# Patient Record
Sex: Male | Born: 1970 | Race: White | Hispanic: No | Marital: Single | Smoking: Current every day smoker
Health system: Southern US, Community
[De-identification: ages and names within clinical notes are randomized; demographics above are authoritative.]

---

## 2010-02-19 ENCOUNTER — Emergency Department: Payer: Self-pay | Admitting: Internal Medicine

## 2011-07-23 ENCOUNTER — Other Ambulatory Visit: Payer: Self-pay | Admitting: Internal Medicine

## 2011-07-23 ENCOUNTER — Ambulatory Visit
Admission: RE | Admit: 2011-07-23 | Discharge: 2011-07-23 | Disposition: A | Discharge status: Home / Self Care | Payer: PRIVATE HEALTH INSURANCE | Source: Ambulatory Visit | Attending: Internal Medicine | Admitting: Internal Medicine

## 2011-07-23 DIAGNOSIS — T1490XA Injury, unspecified, initial encounter: Secondary | ICD-10-CM

## 2016-12-18 ENCOUNTER — Emergency Department
Admission: EM | Admit: 2016-12-18 | Discharge: 2016-12-18 | Disposition: A | Discharge status: Home / Self Care | Payer: Self-pay | Attending: Emergency Medicine | Admitting: Emergency Medicine

## 2016-12-18 DIAGNOSIS — L739 Follicular disorder, unspecified: Secondary | ICD-10-CM | POA: Insufficient documentation

## 2016-12-18 DIAGNOSIS — F17210 Nicotine dependence, cigarettes, uncomplicated: Secondary | ICD-10-CM | POA: Insufficient documentation

## 2016-12-18 MED ORDER — MUPIROCIN CALCIUM 2 % EX CREA: TOPICAL_CREAM | CUTANEOUS | 0 refills | Status: AC

## 2016-12-18 NOTE — ED Notes (Signed)
See triage note  States he noticed something to right upper leg yesterday   Unsure if he was bitten by something  Area red and sl swollen

## 2016-12-18 NOTE — ED Provider Notes (Signed)
[facility] Emergency Department Provider Note   ____________________________________________    I have reviewed the triage vital signs and the nursing notes.   HISTORY  Chief Complaint Abscess     HPI Jesse Stuart is a 46 y.o. male who presents with complaints of small area of redness in his right proximal thigh which he noticed last night while he was taking a shower. He pushed on the area and expressed white fluid. He is concerned that it may be a spider bite. No fevers or chills. Otherwise feels well. No spreading redness.   History reviewed. No pertinent past medical history.  There are no active problems to display for this patient.   History reviewed. No pertinent surgical history.  Prior to Admission medications   Medication Sig Start Date End Date Taking? Authorizing Provider  mupirocin cream (BACTROBAN) 2 % Apply to affected area 3 times daily 12/18/16 12/18/17  Jene EveryKinner, Neithan Day, MD     Allergies Ibuprofen  No family history on file.  Social History Social History  Substance Use Topics  . Smoking status: Current Every Day Smoker    Packs/day: 1.00    Types: Cigarettes  . Smokeless tobacco: Never Used  . Alcohol use No    Review of Systems  Constitutional: No fever/chills     Gastrointestinal: No abdominal pain.  No nausea, no vomiting.   Genitourinary: Negative for dysuria. Musculoskeletal: Chronic right leg pain from prior accident Skin: As above     ____________________________________________   PHYSICAL EXAM:  VITAL SIGNS: ED Triage Vitals  Enc Vitals Group     BP 12/18/16 1151 (!) 141/89     Pulse Rate 12/18/16 1151 92     Resp 12/18/16 1151 15     Temp --      Temp src --      SpO2 12/18/16 1151 98 %     Weight 12/18/16 1152 170 lb (77.1 kg)     Height 12/18/16 1152 5\' 9"  (1.753 m)     Head Circumference --      Peak Flow --      Pain Score 12/18/16 1151 9     Pain Loc --      Pain Edu? --     Excl. in GC? --     Constitutional: Alert and oriented. No acute distress. Pleasant and interactive Eyes: Conjunctivae are normal.    Cardiovascular: Normal rate, regular rhythm.  Respiratory: Normal respiratory effort.  No retractions. Genitourinary: deferred Musculoskeletal: No lower extremity tenderness nor edema.   Neurologic:  Normal speech and language. No gross focal neurologic deficits are appreciated.   Skin:  Skin is warm, dry and intact. 2 x 2's and area of redness to the right proximal inner thigh , no fluctuance, most consistent with mild folliculitis   ____________________________________________   LABS (all labs ordered are listed, but only abnormal results are displayed)  Labs Reviewed - No data to display ____________________________________________  EKG   ____________________________________________  RADIOLOGY  None ____________________________________________   PROCEDURES  Procedure(s) performed: No    Critical Care performed: No ____________________________________________   INITIAL IMPRESSION / ASSESSMENT AND PLAN / ED COURSE  Pertinent labs & imaging results that were available during my care of the patient were reviewed by me and considered in my medical decision making (see chart for details).  Reassuring exam, no fluctuance or need for I&D. We'll treat with mupirocin   ____________________________________________   FINAL CLINICAL IMPRESSION(S) / ED DIAGNOSES  Final  diagnoses:  Folliculitis      NEW MEDICATIONS STARTED DURING THIS VISIT:  New Prescriptions   MUPIROCIN CREAM (BACTROBAN) 2 %    Apply to affected area 3 times daily     Note:  This document was prepared using Dragon voice recognition software and may include unintentional dictation errors.    Jene Every, MD 12/18/16 1236

## 2016-12-18 NOTE — ED Triage Notes (Signed)
Pt c/o "spider bite" to right upper leg - pt noticed area last night - area is red around the outer edges with dark center - pt reports that last night he "mashed" the pus out of the center

## 2020-06-29 ENCOUNTER — Other Ambulatory Visit: Payer: Self-pay | Admitting: Internal Medicine

## 2020-06-29 DIAGNOSIS — M79604 Pain in right leg: Secondary | ICD-10-CM

## 2020-07-04 ENCOUNTER — Ambulatory Visit: Payer: Self-pay

## 2020-07-08 ENCOUNTER — Other Ambulatory Visit: Payer: Self-pay

## 2020-07-08 ENCOUNTER — Ambulatory Visit
Admission: RE | Admit: 2020-07-08 | Discharge: 2020-07-08 | Disposition: A | Discharge status: Home / Self Care | Payer: Self-pay | Source: Ambulatory Visit | Attending: Internal Medicine | Admitting: Internal Medicine

## 2020-07-08 DIAGNOSIS — M79604 Pain in right leg: Secondary | ICD-10-CM | POA: Insufficient documentation

## 2021-10-04 IMAGING — US US EXTREM LOW VENOUS*R*
1 series · 13 of 24 positions shown · non-contrast
Comparison: None.

CLINICAL DATA: Right lower extremity edema for the past month.
Remote history of femur fracture. Evaluate for DVT.



[Series 1: us venous img lower uni right (dvt) · portal-venous · 13 of 32 slices shown]
[im 1/32]
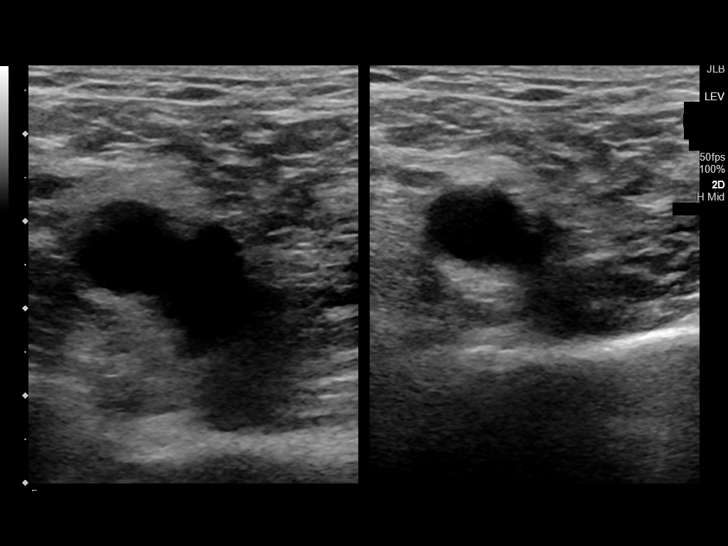
[im 3/32]
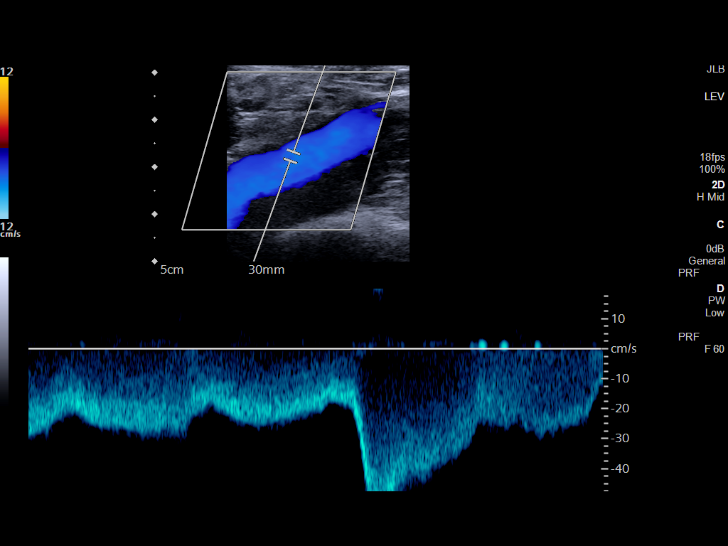
[im 6/32]
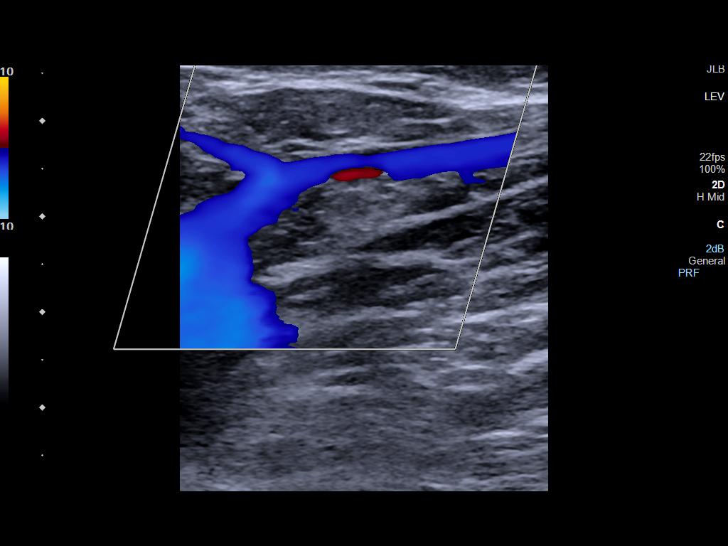
[im 9/32]
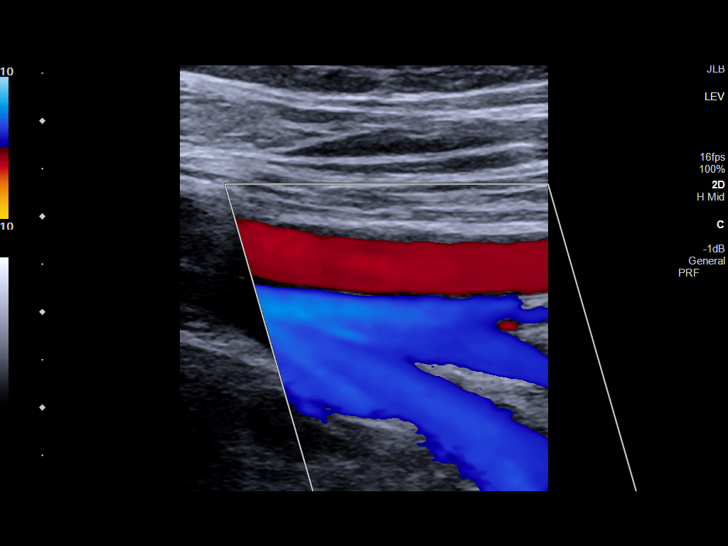
[im 11/32]
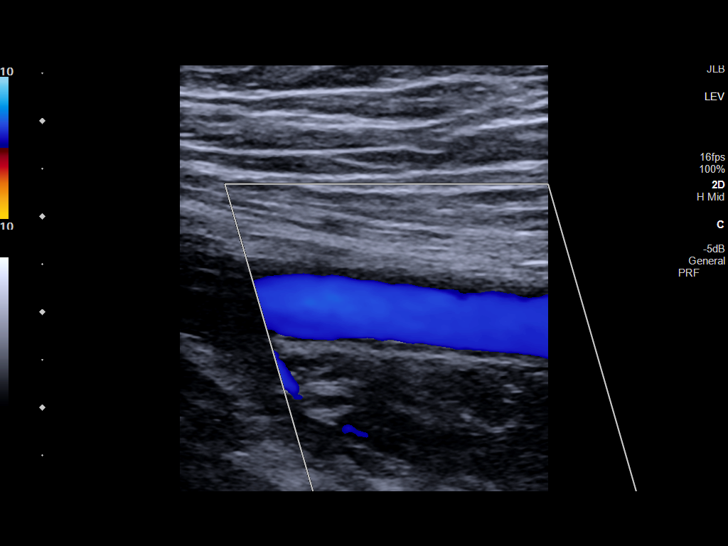
[im 14/32]
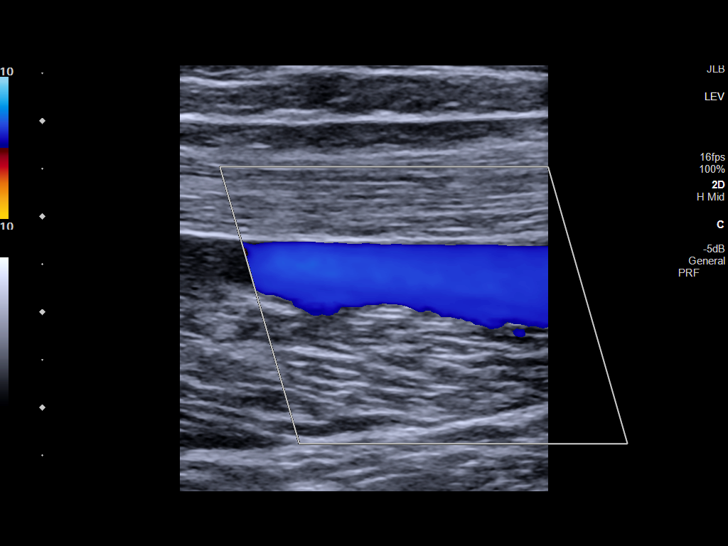
[im 17/32]
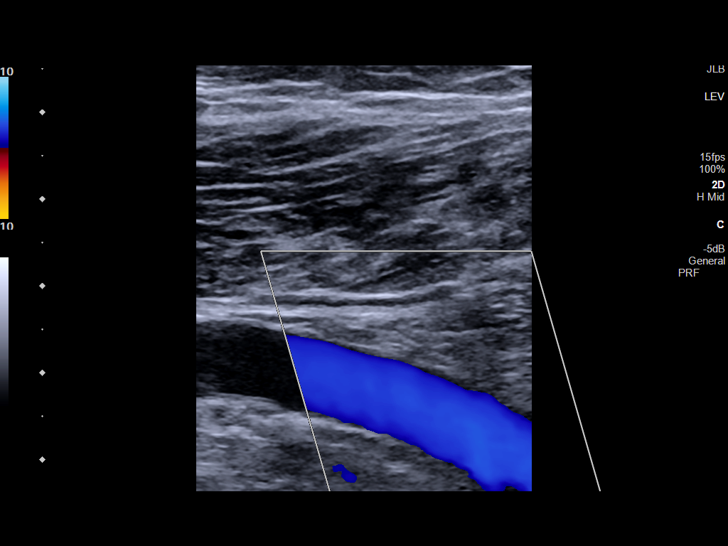
[im 18/32]
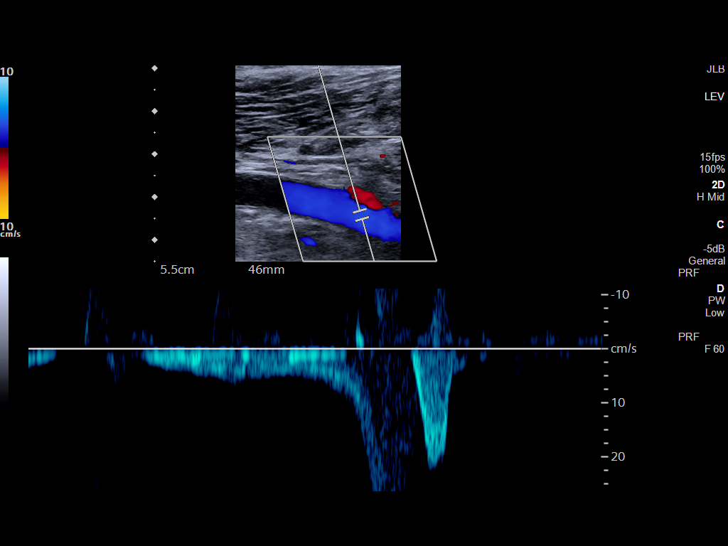
[im 21/32]
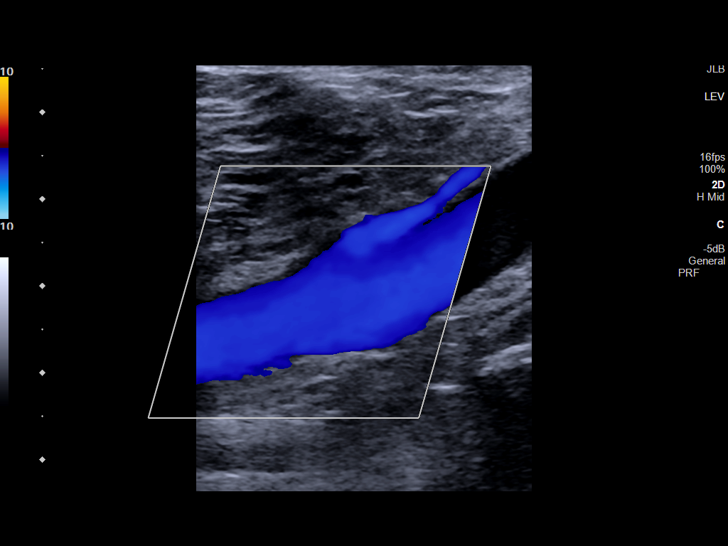
[im 23/32]
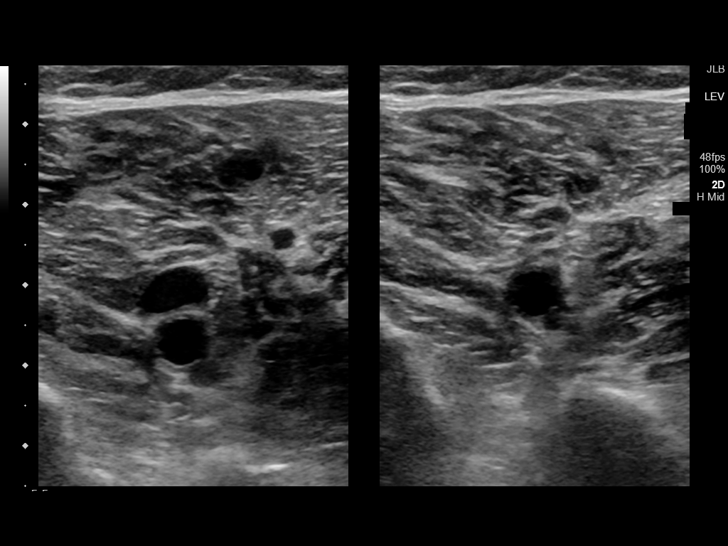
[im 26/32]
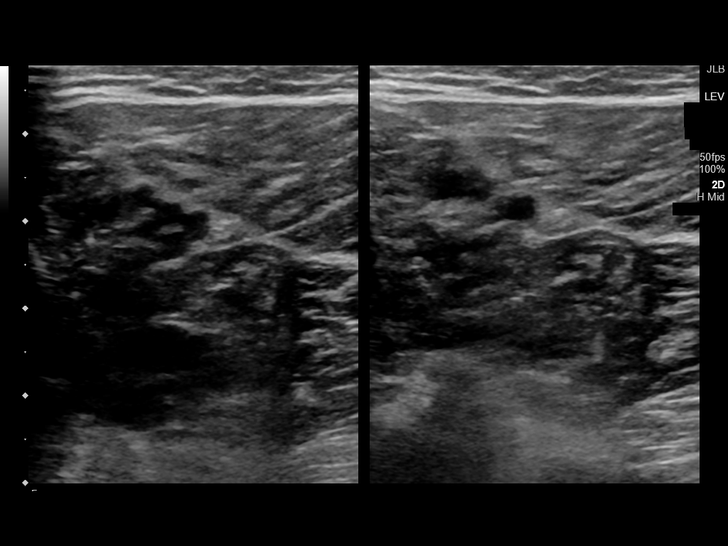
[im 29/32]
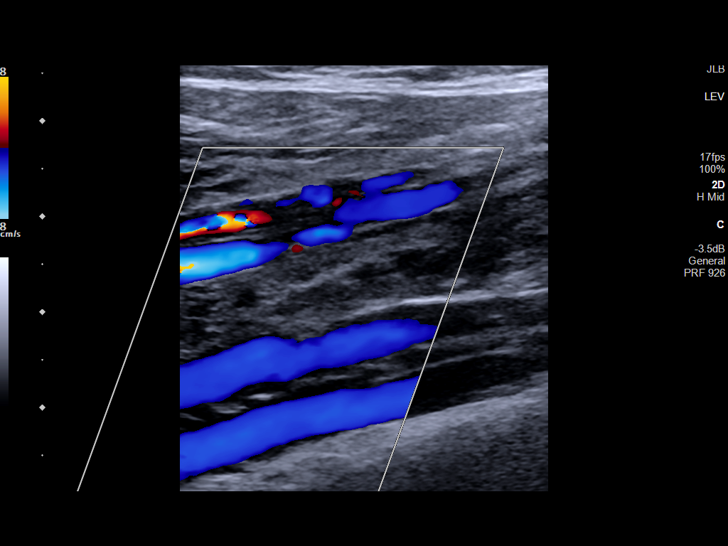
[im 32/32]
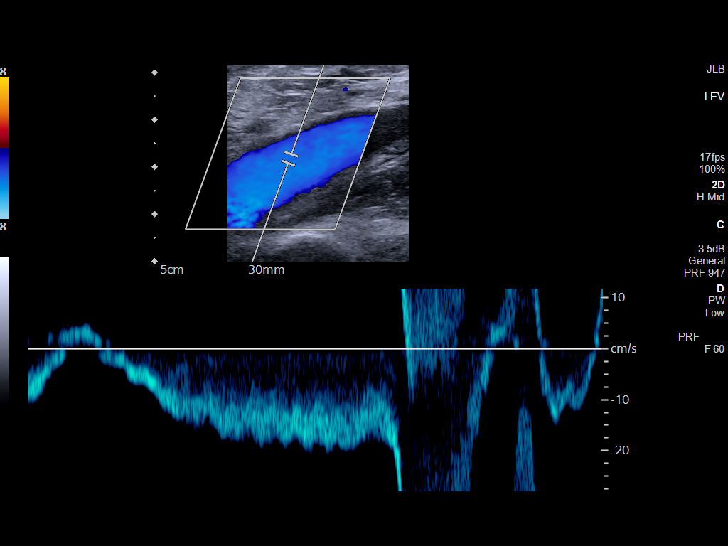

[13 of 24 positions shown; findings below may reference images not displayed]

FINDINGS: Contralateral Common Femoral Vein: Respiratory phasicity is normal
and symmetric with the symptomatic side. No evidence of thrombus.
Normal compressibility.

Common Femoral Vein: No evidence of thrombus. Normal
compressibility, respiratory phasicity and response to augmentation.

Saphenofemoral Junction: No evidence of thrombus. Normal
compressibility and flow on color Doppler imaging.

Profunda Femoral Vein: No evidence of thrombus. Normal
compressibility and flow on color Doppler imaging.

Femoral Vein: No evidence of thrombus. Normal compressibility,
respiratory phasicity and response to augmentation.

Popliteal Vein: No evidence of thrombus. Normal compressibility,
respiratory phasicity and response to augmentation.

Calf Veins: No evidence of thrombus. Normal compressibility and flow
on color Doppler imaging.

Superficial Great Saphenous Vein: No evidence of thrombus. Normal
compressibility.

Venous Reflux:  None.

Other Findings:  None.
IMPRESSION: No evidence of DVT within the right lower extremity.
